# Patient Record
Sex: Male | Born: 2000 | Race: White | Hispanic: No | Marital: Single | Smoking: Never smoker
Health system: Southern US, Community
[De-identification: ages and names within clinical notes are randomized; demographics above are authoritative.]

## PROBLEM LIST (undated history)

## (undated) DIAGNOSIS — S62101A Fracture of unspecified carpal bone, right wrist, initial encounter for closed fracture: Secondary | ICD-10-CM

## (undated) HISTORY — PX: NO PAST SURGERIES: SHX2092

## (undated) HISTORY — DX: Fracture of unspecified carpal bone, right wrist, initial encounter for closed fracture: S62.101A

---
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
## 2018-05-11 ENCOUNTER — Telehealth: Payer: Self-pay | Admitting: Internal Medicine

## 2018-05-11 NOTE — Telephone Encounter (Signed)
Relation to pt: mother / Pamala Hurry  Call back number: [phone]  Reason for call:  Buel Ream [phone] patient of Dr. Quay Burow would like to refer her son to establish care. Mother stated patient is extremely low maintenance currently in college and would like to transition from Peds to PCP, please advise

## 2018-05-11 NOTE — Telephone Encounter (Signed)
Yes, I will accept him

## 2018-05-13 NOTE — Telephone Encounter (Signed)
LVM for patient Raymond Brock or patient to call back to schedule Please call office for questions or concerns.  May be able to fit in.

## 2018-05-17 NOTE — Telephone Encounter (Signed)
appt made

## 2018-06-08 ENCOUNTER — Telehealth: Payer: Self-pay

## 2018-06-08 DIAGNOSIS — Z23 Encounter for immunization: Secondary | ICD-10-CM

## 2018-06-08 NOTE — Telephone Encounter (Signed)
Pt is transferring care to Dr. Quay Burow on 07/11/2018.  Rq for pt have have Bexsero vaccine. NCIR printed by [employer]. Vaccines are Up to Date other than Meningococcal B.   Per verbal okay from Dr. Quay Burow, okay to give at nurse visit before transfer care visit. Pt has been scheduled for a nurse visit on Monday, tentatively at 10am for now. This time may need to be change.   Order for vaccine has been entered on the nurse visit.

## 2018-06-08 NOTE — Telephone Encounter (Signed)
Yes ok to give

## 2018-06-09 NOTE — Telephone Encounter (Signed)
Vaccine ordered for [facility] office

## 2018-06-13 ENCOUNTER — Ambulatory Visit (INDEPENDENT_AMBULATORY_CARE_PROVIDER_SITE_OTHER): Payer: PRIVATE HEALTH INSURANCE

## 2018-06-13 DIAGNOSIS — Z299 Encounter for prophylactic measures, unspecified: Secondary | ICD-10-CM | POA: Diagnosis not present

## 2018-06-13 DIAGNOSIS — Z23 Encounter for immunization: Secondary | ICD-10-CM | POA: Diagnosis not present

## 2018-06-13 NOTE — Progress Notes (Signed)
Raymond Brock

## 2018-07-10 NOTE — Progress Notes (Signed)
Subjective:    Patient ID: Raymond Brock, male    DOB: 2001-03-01, 17 y.o.   MRN: [id number]  HPI  He is here to establish with a new pcp.   He is here for a physical exam.   He is getting over a cold now, but feels he is on the tail end of it.  He has some nasal congestion and wet cough.  He is taking otc cold meds.    He has no concerns.  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 He is not sure what he will be studying.    His left clavicle goes in and out of place.  It causes some discomfort at times.  He denies any major injury.    Medications and allergies reviewed with patient and updated if appropriate.  Patient Active Problem List   Diagnosis Date Noted  . Right-sided low back pain without sciatica 05/23/2015  . 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 Heel pain 03/13/2013    No current outpatient medications on file prior to visit.   No current facility-administered medications on file prior to visit.     Past Medical History:  Diagnosis Date  . 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 NO PAST SURGERIES      Social History   Socioeconomic History  . Marital status: Single    Spouse name: Not on file  . Number of children: Not on file  . Years of education: Not on file  . Highest education level: Not on file  Occupational History  . Not on file  Social Needs  . Financial resource strain: Not on file  . Food insecurity:    Worry: Not on file    Inability: Not on file  . Transportation needs:    Medical: Not on file    Non-medical: Not on file  Tobacco Use  . Smoking status: Never Smoker  . Smokeless tobacco: Never Used  Substance and Sexual Activity  . Alcohol use: No  . Drug use: No  . Sexual activity: Not on file  Lifestyle  . Physical activity:    Days per week: Not on file    Minutes per session: Not on file  .  Stress: Not on file  Relationships  . Social connections:    Talks on phone: Not on file    Gets together: Not on file    Attends religious service: Not on file    Active member of club or organization: Not on file    Attends meetings of clubs or organizations: Not on file    Relationship status: Not on file  Other Topics Concern  . Not on file  Social History Narrative  . Not on file    Family History  Problem Relation Age of Onset  . Cancer Mother   . Atrial fibrillation Father   . Hypertension Maternal Grandfather   . Cancer Maternal Grandfather        Colon  . Hyperlipidemia Maternal Grandfather   . Cancer Paternal Grandmother   . Hypertension Maternal Grandmother     Review of Systems  Constitutional: Negative for chills, fatigue and fever.  HENT: Positive for congestion.   Eyes: Negative for visual disturbance.  Respiratory: Positive for cough. Negative for shortness of breath and wheezing.   Cardiovascular: Negative for  chest pain, palpitations and leg swelling.  Gastrointestinal: Negative for abdominal pain, blood in stool, constipation, diarrhea and nausea.       No gerd  Genitourinary: Negative for dysuria and hematuria.  Musculoskeletal: Positive for back pain (rare). Negative for arthralgias.  Skin: Negative for color change and rash.  Neurological: Negative for light-headedness and headaches.  Psychiatric/Behavioral: Negative for dysphoric mood and sleep disturbance. The patient is not nervous/anxious.        Objective:   Vitals:   07/11/18 0815  BP: 118/70  Pulse: 82  Resp: 16  Temp: 98.1 F (36.7 C)  SpO2: 99%   Filed Weights   07/11/18 0815  Weight: 159 lb (72.1 kg)   Body mass index is 19.87 kg/m.  Wt Readings from Last 3 Encounters:  07/11/18 159 lb (72.1 kg) (60 %, Z= 0.25)*  01/08/17 155 lb 3.2 oz (70.4 kg) (65 %, Z= 0.37)*  05/28/15 141 lb (64 kg) (62 %, Z= 0.31)*   * Growth percentiles are based on CDC (Boys, 2-20 Years) data.      Physical Exam Constitutional: He appears well-developed and well-nourished. No distress.  HENT:  Head: Normocephalic and atraumatic.  Right Ear: External ear normal.  Left Ear: External ear normal.  Mouth/Throat: Oropharynx is clear and moist.  Normal ear canals and TM b/l  Eyes: Conjunctivae and EOM are normal.  Neck: Neck supple. No tracheal deviation present. No thyromegaly present.  No carotid bruit  Cardiovascular: Normal rate, regular rhythm, normal heart sounds and intact distal pulses.   No murmur heard. Pulmonary/Chest: Effort normal and breath sounds normal. No respiratory distress. He has no wheezes. He has no rales.  Abdominal: Soft. He exhibits no distension. There is no tenderness.  Genitourinary: deferred  Musculoskeletal: He exhibits no edema.  Lymphadenopathy:   He has no cervical adenopathy.  Skin: Skin is warm and dry. He is not diaphoretic.  Psychiatric: He has a normal mood and affect. His behavior is normal.         Assessment & Plan:   Physical exam: Screening blood work   deferred Immunizations   Meningitis #2 today , we do not have the flu vaccine to give Exercise  Goes to gym Weight    Good for age Skin        No concerns Substance abuse   None Discussed self testicular exams Discussed briefly going to college  See Problem List for Assessment and Plan of chronic medical problems.    FU as needed

## 2018-07-11 ENCOUNTER — Encounter: Payer: Self-pay | Admitting: Internal Medicine

## 2018-07-11 ENCOUNTER — Ambulatory Visit (INDEPENDENT_AMBULATORY_CARE_PROVIDER_SITE_OTHER): Payer: PRIVATE HEALTH INSURANCE | Admitting: Internal Medicine

## 2018-07-11 VITALS — BP 118/70 | HR 82 | Temp 98.1°F | Resp 16 | Ht 75.0 in | Wt 159.0 lb

## 2018-07-11 DIAGNOSIS — Z23 Encounter for immunization: Secondary | ICD-10-CM | POA: Diagnosis not present

## 2018-07-11 DIAGNOSIS — Z0000 Encounter for general adult medical examination without abnormal findings: Secondary | ICD-10-CM | POA: Diagnosis not present

## 2018-07-11 NOTE — Patient Instructions (Addendum)
All other Health Maintenance issues reviewed.   All recommended immunizations and age-appropriate screenings are up-to-date or discussed.  Meningitis immunization administered today.   Medications reviewed and updated.  No changes recommended at this time.      Health Maintenance, Male A healthy lifestyle and preventive care is important for your health and wellness. Ask your health care provider about what schedule of regular examinations is right for you. What should I know about weight and diet? Eat a Healthy Diet  Eat plenty of vegetables, fruits, whole grains, low-fat dairy products, and lean protein.  Do not eat a lot of foods high in solid fats, added sugars, or salt.  Maintain a Healthy Weight Regular exercise can help you achieve or maintain a healthy weight. You should:  Do at least 150 minutes of exercise each week. The exercise should increase your heart rate and make you sweat (moderate-intensity exercise).  Do strength-training exercises at least twice a week.  Watch Your Levels of Cholesterol and Blood Lipids  Have your blood tested for lipids and cholesterol every 5 years starting at 19 years of age. If you are at high risk for heart disease, you should start having your blood tested when you are 19 years old. You may need to have your cholesterol levels checked more often if: ? Your lipid or cholesterol levels are high. ? You are older than 19 years of age. ? You are at high risk for heart disease.  What should I know about cancer screening? Many types of cancers can be detected early and may often be prevented. Lung Cancer  You should be screened every year for lung cancer if: ? You are a current smoker who has smoked for at least 30 years. ? You are a former smoker who has quit within the past 15 years.  Talk to your health care provider about your screening options, when you should start screening, and how often you should be screened.  Colorectal  Cancer  Routine colorectal cancer screening usually begins at 19 years of age and should be repeated every 5-10 years until you are 19 years old. You may need to be screened more often if early forms of precancerous polyps or small growths are found. Your health care provider may recommend screening at an earlier age if you have risk factors for colon cancer.  Your health care provider may recommend using home test kits to check for hidden blood in the stool.  A small camera at the end of a tube can be used to examine your colon (sigmoidoscopy or colonoscopy). This checks for the earliest forms of colorectal cancer.  Prostate and Testicular Cancer  Depending on your age and overall health, your health care provider may do certain tests to screen for prostate and testicular cancer.  Talk to your health care provider about any symptoms or concerns you have about testicular or prostate cancer.  Skin Cancer  Check your skin from head to toe regularly.  Tell your health care provider about any new moles or changes in moles, especially if: ? There is a change in a mole's size, shape, or color. ? You have a mole that is larger than a pencil eraser.  Always use sunscreen. Apply sunscreen liberally and repeat throughout the day.  Protect yourself by wearing long sleeves, pants, a wide-brimmed hat, and sunglasses when outside.  What should I know about heart disease, diabetes, and high blood pressure?  If you are 16-2 years of age, have  your blood pressure checked every 3-5 years. If you are 85 years of age or older, have your blood pressure checked every year. You should have your blood pressure measured twice-once when you are at a hospital or clinic, and once when you are not at a hospital or clinic. Record the average of the two measurements. To check your blood pressure when you are not at a hospital or clinic, you can use: ? An automated blood pressure machine at a pharmacy. ? A home blood  pressure monitor.  Talk to your health care provider about your target blood pressure.  If you are between 64-84 years old, ask your health care provider if you should take aspirin to prevent heart disease.  Have regular diabetes screenings by checking your fasting blood sugar level. ? If you are at a normal weight and have a low risk for diabetes, have this test once every three years after the age of 81. ? If you are overweight and have a high risk for diabetes, consider being tested at a younger age or more often.  A one-time screening for abdominal aortic aneurysm (AAA) by ultrasound is recommended for men aged 22-75 years who are current or former smokers. What should I know about preventing infection? Hepatitis B If you have a higher risk for hepatitis B, you should be screened for this virus. Talk with your health care provider to find out if you are at risk for hepatitis B infection. Hepatitis C Blood testing is recommended for:  Everyone born from [birth year] through 1967.  Anyone with known risk factors for hepatitis C.  Sexually Transmitted Diseases (STDs)  You should be screened each year for STDs including gonorrhea and chlamydia if: ? You are sexually active and are younger than 19 years of age. ? You are older than 19 years of age and your health care provider tells you that you are at risk for this type of infection. ? Your sexual activity has changed since you were last screened and you are at an increased risk for chlamydia or gonorrhea. Ask your health care provider if you are at risk.  Talk with your health care provider about whether you are at high risk of being infected with HIV. Your health care provider may recommend a prescription medicine to help prevent HIV infection.  What else can I do?  Schedule regular health, dental, and eye exams.  Stay current with your vaccines (immunizations).  Do not use any tobacco products, such as cigarettes, chewing tobacco, and  e-cigarettes. If you need help quitting, ask your health care provider.  Limit alcohol intake to no more than 2 drinks per day. One drink equals 12 ounces of beer, 5 ounces of wine, or 1 ounces of hard liquor.  Do not use street drugs.  Do not share needles.  Ask your health care provider for help if you need support or information about quitting drugs.  Tell your health care provider if you often feel depressed.  Tell your health care provider if you have ever been abused or do not feel safe at home. This information is not intended to replace advice given to you by your health care provider. Make sure you discuss any questions you have with your health care provider. Document Released: 05/14/2008 Document Revised: 07/15/2016 Document Reviewed: 08/20/2015 Elsevier Interactive Patient Education  [employer].

## 2018-10-25 ENCOUNTER — Telehealth: Payer: Self-pay | Admitting: Internal Medicine

## 2018-10-25 MED ORDER — OSELTAMIVIR PHOSPHATE 75 MG PO CAPS: 75.0000 mg | ORAL_CAPSULE | Freq: Two times a day (BID) | ORAL | 0 refills | Status: AC

## 2018-10-25 NOTE — Telephone Encounter (Signed)
Communicated with father 17 yo son with new onset fever, chills, headache. (tttttttemp 103).  Consistent             With influenza. Worth ttreating--  tamiflu 75 bid x 5 days.

## 2018-11-17 ENCOUNTER — Ambulatory Visit (INDEPENDENT_AMBULATORY_CARE_PROVIDER_SITE_OTHER): Payer: PRIVATE HEALTH INSURANCE | Admitting: Internal Medicine

## 2018-11-17 ENCOUNTER — Ambulatory Visit: Payer: PRIVATE HEALTH INSURANCE | Admitting: Family Medicine

## 2018-11-17 ENCOUNTER — Ambulatory Visit (INDEPENDENT_AMBULATORY_CARE_PROVIDER_SITE_OTHER)
Admission: RE | Admit: 2018-11-17 | Discharge: 2018-11-17 | Disposition: A | Discharge status: Home / Self Care | Payer: PRIVATE HEALTH INSURANCE | Source: Ambulatory Visit | Attending: Internal Medicine | Admitting: Internal Medicine

## 2018-11-17 ENCOUNTER — Encounter: Payer: Self-pay | Admitting: Internal Medicine

## 2018-11-17 DIAGNOSIS — J209 Acute bronchitis, unspecified: Secondary | ICD-10-CM | POA: Diagnosis not present

## 2018-11-17 MED ORDER — CEFDINIR 300 MG PO CAPS: 300.0000 mg | ORAL_CAPSULE | Freq: Two times a day (BID) | ORAL | 0 refills | Status: DC

## 2018-11-17 MED FILL — CEFDINIR 300 MG CAPSULE: 300 | 10 days supply | Qty: 20 | Fill #0

## 2018-11-17 NOTE — Patient Instructions (Signed)
You can use over-the-counter  "cold" medicines  such as "Tylenol cold" , "Advil cold",  "Mucinex" or" Mucinex D"  for cough and congestion.   Uou can use "Afrin" nasal spray for nasal congestion as directed. Use " Delsym" or" Robitussin" cough syrup varietis for cough.  You can use plain "Tylenol" or "Advil" for fever, chills and achyness. Use Halls or Ricola cough drops.    Please, make an appointment if you are not better or if you're worse.

## 2018-11-17 NOTE — Progress Notes (Signed)
Subjective:  Patient ID: Raymond Brock, male    DOB: 01/09/2001  Age: 17 y.o. MRN: [id number]  CC: No chief complaint on file.   HPI INRI SOBIESKI presents for URI sx's w/a bad cough x 3 weeks, brown sputum  No outpatient medications prior to visit.   No facility-administered medications prior to visit.     ROS: Review of Systems  Constitutional: Positive for fatigue. Negative for appetite change, fever and unexpected weight change.  HENT: Negative for congestion, nosebleeds, sneezing, sore throat and trouble swallowing.   Eyes: Negative for itching and visual disturbance.  Respiratory: Positive for cough. Negative for shortness of breath.   Cardiovascular: Negative for chest pain, palpitations and leg swelling.  Gastrointestinal: Negative for abdominal distention, blood in stool, diarrhea and nausea.  Genitourinary: Negative for frequency and hematuria.  Musculoskeletal: Negative for back pain, gait problem, joint swelling and neck pain.  Skin: Negative for rash.  Neurological: Negative for dizziness, tremors, speech difficulty and weakness.  Hematological: Negative for adenopathy. Does not bruise/bleed easily.  Psychiatric/Behavioral: Negative for agitation, dysphoric mood and sleep disturbance. The patient is not nervous/anxious.     Objective:  BP (!) 152/88 (BP Location: Left Arm, Patient Position: Sitting, Cuff Size: Normal)   Pulse 79   Temp 98.2 F (36.8 C) (Oral)   Ht 6\' 3"  (1.905 m)   Wt 162 lb (73.5 kg)   SpO2 98%   BMI 20.25 kg/m   BP Readings from Last 3 Encounters:  11/17/18 (!) 152/88  07/11/18 118/70  01/08/17 114/72    Wt Readings from Last 3 Encounters:  11/17/18 162 lb (73.5 kg) (62 %, Z= 0.32)*  07/11/18 159 lb (72.1 kg) (60 %, Z= 0.25)*  01/08/17 155 lb 3.2 oz (70.4 kg) (65 %, Z= 0.37)*   * Growth percentiles are based on CDC (Boys, 2-20 Years) data.    Physical Exam Constitutional:      General: He is not in acute distress.  Appearance: He is well-developed.     Comments: NAD  Eyes:     Conjunctiva/sclera: Conjunctivae normal.     Pupils: Pupils are equal, round, and reactive to light.  Neck:     Musculoskeletal: Normal range of motion.     Thyroid: No thyromegaly.     Vascular: No JVD.  Cardiovascular:     Rate and Rhythm: Normal rate and regular rhythm.     Heart sounds: Normal heart sounds. No murmur. No friction rub. No gallop.   Pulmonary:     Effort: Pulmonary effort is normal. No respiratory distress.     Breath sounds: Normal breath sounds. No wheezing or rales.  Chest:     Chest wall: No tenderness.  Abdominal:     General: Bowel sounds are normal. There is no distension.     Palpations: Abdomen is soft. There is no mass.     Tenderness: There is no abdominal tenderness. There is no guarding or rebound.  Musculoskeletal: Normal range of motion.        General: No tenderness.  Lymphadenopathy:     Cervical: No cervical adenopathy.  Skin:    General: Skin is warm and dry.     Findings: No rash.  Neurological:     Mental Status: He is alert and oriented to person, place, and time.     Cranial Nerves: No cranial nerve deficit.     Motor: No abnormal muscle tone.     Coordination: Coordination normal.  Gait: Gait normal.     Deep Tendon Reflexes: Reflexes are normal and symmetric.  Psychiatric:        Behavior: Behavior normal.        Thought Content: Thought content normal.        Judgment: Judgment normal.   eryth throat  No results found for: WBC, HGB, HCT, PLT, GLUCOSE, CHOL, TRIG, HDL, LDLDIRECT, LDLCALC, ALT, AST, NA, K, CL, CREATININE, BUN, CO2, TSH, PSA, INR, GLUF, HGBA1C, MICROALBUR  Dg Thoracolumabar Spine  Result Date: 05/23/2015 CLINICAL DATA:  Right side low back pain, no sciatica, question Scheuermann's disease EXAM: THORACOLUMBAR SPINE 1V COMPARISON:  None. FINDINGS: Two views of thoracolumbar spine submitted. No acute fracture or subluxation. Alignment, disc spaces and  vertebral body heights are preserved. IMPRESSION: Negative Electronically Signed   By: Lahoma Crocker M.D.   On: 05/23/2015 15:06   Mr Lumbar Spine Wo Contrast  Result Date: 05/23/2015 CLINICAL DATA:  Right lower back pain acute exacerbation, with increased weakness. EXAM: MRI LUMBAR SPINE WITHOUT CONTRAST TECHNIQUE: Multiplanar, multisequence MR imaging of the lumbar spine was performed. No intravenous contrast was administered. COMPARISON:  None FINDINGS: The lowest lumbar type non-rib-bearing vertebra is labeled as L5. The conus medullaris appears normal. Conus level: L1. No vertebral subluxation is observed. I do not see any kyphotic angulation on recent radiographs involving the lower half of the thoracic spine or the lumbar spine. There do appear to be some small Schmorl's nodes in the lower thoracic spine and lumbar spine, most notably along the inferior endplate of L1 on image 5 series 5. Additional findings at individual levels are as follows: L1-2: Unremarkable. L2-3:  Unremarkable. L3-4:  No impingement.  Equivocal disc bulge. L4-5:  No impingement.  Equivocal disc bulge. L5-S1:  Unremarkable. IMPRESSION: 1. No lumbar spine impingement identified to explain the patient's back pain. 2. Equivocal bulging of the disc at L3-4 and L4-5. 3. There several Schmorl' s nodes in the lumbar spine, most notably along the inferior endplate of L1. These can sometimes be associated with Scheuermann's disease, but I do not observe significant wedging in the lumbar spine or visualized portion of the lower thoracic spine on the lateral projection to further suggest Scheuermann's disease. Electronically Signed   By: Van Clines M.D.   On: 05/23/2015 14:32    Assessment & Plan:   There are no diagnoses linked to this encounter.   No orders of the defined types were placed in this encounter.    Follow-up: No follow-ups on file.  Walker Kehr, MD

## 2018-11-17 NOTE — Assessment & Plan Note (Addendum)
CXR today Cefdinir Rx Labs if not better

## 2019-01-24 DIAGNOSIS — J4530 Mild persistent asthma, uncomplicated: Secondary | ICD-10-CM | POA: Diagnosis not present

## 2019-01-24 DIAGNOSIS — J302 Other seasonal allergic rhinitis: Secondary | ICD-10-CM | POA: Diagnosis not present

## 2019-02-16 MED FILL — MONTELUKAST SOD 10 MG TAB: 10 | 30 days supply | Qty: 30 | Fill #0

## 2019-02-16 MED FILL — ARNUITY ELLIPTA 100 MCG INH: 100 | 30 days supply | Qty: 30 | Fill #0

## 2019-04-14 MED FILL — ARNUITY ELLIPTA 100 MCG INH: 100 | 30 days supply | Qty: 30 | Fill #1

## 2019-04-14 MED FILL — MONTELUKAST SOD 10 MG TAB: 10 | 30 days supply | Qty: 30 | Fill #1

## 2019-05-30 MED FILL — ARNUITY ELLIPTA 100 MCG INH: 100 | 30 days supply | Qty: 30 | Fill #2

## 2019-05-30 MED FILL — MONTELUKAST SOD 10 MG TAB: 10 | 30 days supply | Qty: 30 | Fill #2

## 2019-06-13 ENCOUNTER — Other Ambulatory Visit: Payer: Self-pay | Admitting: Internal Medicine

## 2019-06-13 DIAGNOSIS — Z20822 Contact with and (suspected) exposure to covid-19: Secondary | ICD-10-CM

## 2019-06-13 DIAGNOSIS — Z20828 Contact with and (suspected) exposure to other viral communicable diseases: Secondary | ICD-10-CM

## 2019-07-07 ENCOUNTER — Telehealth: Payer: Self-pay

## 2019-07-07 NOTE — Telephone Encounter (Signed)
Appointment scheduled.

## 2019-07-07 NOTE — Telephone Encounter (Signed)
Copied from [place] [id number]. Topic: Appointment Scheduling - Scheduling Inquiry for Clinic >> Jul 07, 2019  2:30 PM Pauline Good wrote: Reason for CRM: pt want to sched a TB Skin test. Please advise

## 2019-07-07 NOTE — Telephone Encounter (Signed)
Pt needs a CPE due on 8/12 can do at CPE if not on thursday

## 2019-07-13 NOTE — Patient Instructions (Addendum)
Tests ordered today. Your results will be released to [facility] (or called to you) after review.  If any changes need to be made, you will be notified at that same time.  All other Health Maintenance issues reviewed.   All recommended immunizations and age-appropriate screenings are up-to-date or discussed.  TB test implanted.  No immunization administered today.   Medications reviewed and updated.  Changes include :   none   Please followup in 1 year   Health Maintenance, Male Adopting a healthy lifestyle and getting preventive care are important in promoting health and wellness. Ask your health care provider about:  The right schedule for you to have regular tests and exams.  Things you can do on your own to prevent diseases and keep yourself healthy. What should I know about diet, weight, and exercise? Eat a healthy diet   Eat a diet that includes plenty of vegetables, fruits, low-fat dairy products, and lean protein.  Do not eat a lot of foods that are high in solid fats, added sugars, or sodium. Maintain a healthy weight Body mass index (BMI) is a measurement that can be used to identify possible weight problems. It estimates body fat based on height and weight. Your health care provider can help determine your BMI and help you achieve or maintain a healthy weight. Get regular exercise Get regular exercise. This is one of the most important things you can do for your health. Most adults should:  Exercise for at least 150 minutes each week. The exercise should increase your heart rate and make you sweat (moderate-intensity exercise).  Do strengthening exercises at least twice a week. This is in addition to the moderate-intensity exercise.  Spend less time sitting. Even light physical activity can be beneficial. Watch cholesterol and blood lipids Have your blood tested for lipids and cholesterol at 20 years of age, then have this test every 5 years. You may need to have your  cholesterol levels checked more often if:  Your lipid or cholesterol levels are high.  You are older than 20 years of age.  You are at high risk for heart disease. What should I know about cancer screening? Many types of cancers can be detected early and may often be prevented. Depending on your health history and family history, you may need to have cancer screening at various ages. This may include screening for:  Colorectal cancer.  Prostate cancer.  Skin cancer.  Lung cancer. What should I know about heart disease, diabetes, and high blood pressure? Blood pressure and heart disease  High blood pressure causes heart disease and increases the risk of stroke. This is more likely to develop in people who have high blood pressure readings, are of African descent, or are overweight.  Talk with your health care provider about your target blood pressure readings.  Have your blood pressure checked: ? Every 3-5 years if you are 14-75 years of age. ? Every year if you are 39 years old or older.  If you are between the ages of 68 and 57 and are a current or former smoker, ask your health care provider if you should have a one-time screening for abdominal aortic aneurysm (AAA). Diabetes Have regular diabetes screenings. This checks your fasting blood sugar level. Have the screening done:  Once every three years after age 62 if you are at a normal weight and have a low risk for diabetes.  More often and at a younger age if you are overweight or have  a high risk for diabetes. What should I know about preventing infection? Hepatitis B If you have a higher risk for hepatitis B, you should be screened for this virus. Talk with your health care provider to find out if you are at risk for hepatitis B infection. Hepatitis C Blood testing is recommended for:  Everyone born from [birth year] through 1967.  Anyone with known risk factors for hepatitis C. Sexually transmitted infections (STIs)  You  should be screened each year for STIs, including gonorrhea and chlamydia, if: ? You are sexually active and are younger than 20 years of age. ? You are older than 20 years of age and your health care provider tells you that you are at risk for this type of infection. ? Your sexual activity has changed since you were last screened, and you are at increased risk for chlamydia or gonorrhea. Ask your health care provider if you are at risk.  Ask your health care provider about whether you are at high risk for HIV. Your health care provider may recommend a prescription medicine to help prevent HIV infection. If you choose to take medicine to prevent HIV, you should first get tested for HIV. You should then be tested every 3 months for as long as you are taking the medicine. Follow these instructions at home: Lifestyle  Do not use any products that contain nicotine or tobacco, such as cigarettes, e-cigarettes, and chewing tobacco. If you need help quitting, ask your health care provider.  Do not use street drugs.  Do not share needles.  Ask your health care provider for help if you need support or information about quitting drugs. Alcohol use  Do not drink alcohol if your health care provider tells you not to drink.  If you drink alcohol: ? Limit how much you have to 0-2 drinks a day. ? Be aware of how much alcohol is in your drink. In the [place] one drink equals one 12 oz bottle of beer (355 mL), one 5 oz glass of wine (148 mL), or one 1 oz glass of hard liquor (44 mL). General instructions  Schedule regular health, dental, and eye exams.  Stay current with your vaccines.  Tell your health care provider if: ? You often feel depressed. ? You have ever been abused or do not feel safe at home. Summary  Adopting a healthy lifestyle and getting preventive care are important in promoting health and wellness.  Follow your health care provider's instructions about healthy diet, exercising, and  getting tested or screened for diseases.  Follow your health care provider's instructions on monitoring your cholesterol and blood pressure. This information is not intended to replace advice given to you by your health care provider. Make sure you discuss any questions you have with your health care provider. Document Released: 05/14/2008 Document Revised: 11/09/2018 Document Reviewed: 11/09/2018 Elsevier Patient Education  2020 [employer].

## 2019-07-13 NOTE — Progress Notes (Signed)
Subjective:    Patient ID: Raymond Brock, male    DOB: 06-Dec-2000, 18 y.o.   MRN: [id number]  HPI He is here for a physical exam.   He denies any changes in his health.  He needs a TB test.  He has no concerns.    Medications and allergies reviewed with patient and updated if appropriate.  Patient Active Problem List   Diagnosis Date Noted  . Right-sided low back pain without sciatica 05/23/2015  . 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 Heel pain 03/13/2013    No current outpatient medications on file prior to visit.   No current facility-administered medications on file prior to visit.     Past Medical History:  Diagnosis Date  . 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 NO PAST SURGERIES      Social History   Socioeconomic History  . Marital status: Single    Spouse name: Not on file  . Number of children: Not on file  . Years of education: Not on file  . Highest education level: Not on file  Occupational History  . Not on file  Social Needs  . Financial resource strain: Not on file  . Food insecurity    Worry: Not on file    Inability: Not on file  . Transportation needs    Medical: Not on file    Non-medical: Not on file  Tobacco Use  . Smoking status: Never Smoker  . Smokeless tobacco: Never Used  Substance and Sexual Activity  . Alcohol use: No  . Drug use: No  . Sexual activity: Not on file  Lifestyle  . Physical activity    Days per week: Not on file    Minutes per session: Not on file  . Stress: Not on file  Relationships  . Social Herbalist on phone: Not on file    Gets together: Not on file    Attends religious service: Not on file    Active member of club or organization: Not on file    Attends meetings of clubs or organizations: Not on file    Relationship status: Not on file  Other Topics Concern  . Not on file  Social History  Narrative  . Not on file    Family History  Problem Relation Age of Onset  . Cancer Mother   . Atrial fibrillation Father   . Hypertension Maternal Grandfather   . Cancer Maternal Grandfather        Colon  . Hyperlipidemia Maternal Grandfather   . Cancer Paternal Grandmother   . Hypertension Maternal Grandmother     Review of Systems  Constitutional: Negative for chills and fever.  Eyes: Negative for visual disturbance.  Respiratory: Negative for cough, shortness of breath and wheezing.   Cardiovascular: Negative for chest pain, palpitations and leg swelling.  Gastrointestinal: Negative for abdominal pain, blood in stool, constipation, diarrhea and nausea.  Genitourinary: Negative for dysuria and hematuria.  Musculoskeletal: Negative for arthralgias and back pain.  Skin: Negative for color change and rash.  Neurological: Negative for light-headedness and headaches.  Psychiatric/Behavioral: Negative for dysphoric mood. The patient is not nervous/anxious.        Objective:   Vitals:   07/14/19 1001  BP: 124/76  Pulse: 80  Resp: 14  Temp: 98 F (36.7 C)  SpO2: 98%  Filed Weights   07/14/19 1001  Weight: 159 lb 12.8 oz (72.5 kg)   Body mass index is 19.97 kg/m.  Wt Readings from Last 3 Encounters:  07/14/19 159 lb 12.8 oz (72.5 kg)  11/17/18 162 lb (73.5 kg) (62 %, Z= 0.32)*  07/11/18 159 lb (72.1 kg) (60 %, Z= 0.25)*   * Growth percentiles are based on CDC (Boys, 2-20 Years) data.     Physical Exam Constitutional: He appears well-developed and well-nourished. No distress.  HENT:  Head: Normocephalic and atraumatic.  Right Ear: External ear normal.  Left Ear: External ear normal.  Mouth/Throat: Oropharynx is clear and moist.  Normal ear canals and TM b/l  Eyes: Conjunctivae and EOM are normal.  Neck: Neck supple. No tracheal deviation present. No thyromegaly present.  No carotid bruit  Cardiovascular: Normal rate, regular rhythm, normal heart sounds and  intact distal pulses.   No murmur heard. Pulmonary/Chest: Effort normal and breath sounds normal. No respiratory distress. He has no wheezes. He has no rales.  Abdominal: Soft. He exhibits no distension. There is no tenderness.  Genitourinary: deferred  Musculoskeletal: He exhibits no edema.  Lymphadenopathy:   He has no cervical adenopathy.  Skin: Skin is warm and dry. He is not diaphoretic.  Psychiatric: He has a normal mood and affect. His behavior is normal.         Assessment & Plan:   Physical exam: Screening blood work  ordered Immunizations  tdap up to date, recommended flu vaccine Exercise  regular Weight  Normal BMI Skin no concerns.  Substance abuse   none  See Problem List for Assessment and Plan of chronic medical problems.  FU in one year

## 2019-07-14 ENCOUNTER — Encounter: Payer: Self-pay | Admitting: Internal Medicine

## 2019-07-14 ENCOUNTER — Other Ambulatory Visit: Payer: Self-pay

## 2019-07-14 ENCOUNTER — Ambulatory Visit (INDEPENDENT_AMBULATORY_CARE_PROVIDER_SITE_OTHER): Payer: 59 | Admitting: Internal Medicine

## 2019-07-14 ENCOUNTER — Other Ambulatory Visit (INDEPENDENT_AMBULATORY_CARE_PROVIDER_SITE_OTHER): Payer: 59

## 2019-07-14 VITALS — BP 124/76 | HR 80 | Temp 98.0°F | Resp 14 | Ht 75.0 in | Wt 159.8 lb

## 2019-07-14 DIAGNOSIS — Z0000 Encounter for general adult medical examination without abnormal findings: Secondary | ICD-10-CM | POA: Diagnosis not present

## 2019-07-14 DIAGNOSIS — Z23 Encounter for immunization: Secondary | ICD-10-CM

## 2019-07-14 LAB — CBC WITH DIFFERENTIAL/PLATELET
Basophils Absolute: 0.1 10*3/uL (ref 0.0–0.1)
Basophils Relative: 1 % (ref 0.0–3.0)
Eosinophils Absolute: 0.7 10*3/uL (ref 0.0–0.7)
Eosinophils Relative: 10.6 % — ABNORMAL HIGH (ref 0.0–5.0)
HCT: 46.7 % (ref 39.0–52.0)
Hemoglobin: 16.5 g/dL (ref 13.0–17.0)
Lymphocytes Relative: 36.9 % (ref 12.0–46.0)
Lymphs Abs: 2.5 10*3/uL (ref 0.7–4.0)
MCHC: 35.3 g/dL (ref 30.0–36.0)
MCV: 89.2 fl (ref 78.0–100.0)
Monocytes Absolute: 0.5 10*3/uL (ref 0.1–1.0)
Monocytes Relative: 6.8 % (ref 3.0–12.0)
Neutro Abs: 3.1 10*3/uL (ref 1.4–7.7)
Neutrophils Relative %: 44.7 % (ref 43.0–77.0)
Platelets: 259 10*3/uL (ref 150.0–400.0)
RBC: 5.24 Mil/uL (ref 4.22–5.81)
RDW: 11.9 % (ref 11.5–14.6)
WBC: 6.9 10*3/uL (ref 4.5–10.5)

## 2019-07-14 LAB — COMPREHENSIVE METABOLIC PANEL
ALT: 12 U/L (ref 0–53)
AST: 14 U/L (ref 0–37)
Albumin: 5 g/dL (ref 3.5–5.2)
Alkaline Phosphatase: 89 U/L (ref 39–117)
BUN: 19 mg/dL (ref 6–23)
CO2: 28 mEq/L (ref 19–32)
Calcium: 9.8 mg/dL (ref 8.4–10.5)
Chloride: 103 mEq/L (ref 96–112)
Creatinine, Ser: 1.05 mg/dL (ref 0.40–1.50)
GFR: 90 mL/min (ref >60.00)
Glucose, Bld: 99 mg/dL (ref 70–99)
Potassium: 3.8 mEq/L (ref 3.5–5.1)
Sodium: 139 mEq/L (ref 135–145)
Total Bilirubin: 0.8 mg/dL (ref 0.2–1.2)
Total Protein: 7.4 g/dL (ref 6.0–8.3)

## 2019-07-14 LAB — LIPID PANEL
Cholesterol: 125 mg/dL (ref 0–200)
HDL: 47.3 mg/dL (ref >39.00)
LDL Cholesterol: 64 mg/dL (ref 0–99)
NonHDL: 77.83
Total CHOL/HDL Ratio: 3
Triglycerides: 69 mg/dL (ref 0.0–149.0)
VLDL: 13.8 mg/dL (ref 0.0–40.0)

## 2019-07-14 LAB — TSH: TSH: 3.63 u[IU]/mL (ref 0.35–5.50)

## 2019-07-14 MED FILL — MONTELUKAST SOD 10 MG TAB: 10 | 30 days supply | Qty: 30 | Fill #3

## 2019-07-17 LAB — TB SKIN TEST
Induration: 0 mm
TB Skin Test: NEGATIVE

## 2019-08-15 ENCOUNTER — Ambulatory Visit: Payer: Self-pay

## 2019-08-15 ENCOUNTER — Other Ambulatory Visit: Payer: 59

## 2019-08-15 ENCOUNTER — Encounter: Payer: Self-pay | Admitting: Internal Medicine

## 2019-08-15 ENCOUNTER — Other Ambulatory Visit: Payer: Self-pay

## 2019-08-15 ENCOUNTER — Ambulatory Visit (INDEPENDENT_AMBULATORY_CARE_PROVIDER_SITE_OTHER): Payer: 59 | Admitting: Internal Medicine

## 2019-08-15 VITALS — BP 126/78 | HR 72 | Temp 99.0°F | Resp 16 | Ht 75.0 in | Wt 153.8 lb

## 2019-08-15 DIAGNOSIS — Z111 Encounter for screening for respiratory tuberculosis: Secondary | ICD-10-CM

## 2019-08-15 DIAGNOSIS — Z23 Encounter for immunization: Secondary | ICD-10-CM

## 2019-08-15 DIAGNOSIS — H00019 Hordeolum externum unspecified eye, unspecified eyelid: Secondary | ICD-10-CM | POA: Insufficient documentation

## 2019-08-15 DIAGNOSIS — H00011 Hordeolum externum right upper eyelid: Secondary | ICD-10-CM | POA: Diagnosis not present

## 2019-08-15 MED ORDER — POLYMYXIN B-TRIMETHOPRIM 10000-0.1 UNIT/ML-% OP SOLN: 1.0000 [drp] | OPHTHALMIC | 0 refills | Status: DC

## 2019-08-15 MED FILL — POLYMYXIN B/TMP EYE DROPS: 10000-0.1 | 30 days supply | Qty: 10 | Fill #0

## 2019-08-15 MED FILL — MONTELUKAST SOD 10 MG TAB: 10 | 90 days supply | Qty: 90 | Fill #4

## 2019-08-15 NOTE — Telephone Encounter (Signed)
Can he come in at 1: 15?

## 2019-08-15 NOTE — Telephone Encounter (Signed)
Mom reports pt. Noticed swelling to right eye yesterday. More swelling this morning to the upper eye lid. No drainage noted. No change in vision. No runny nose or cough or allergy symptom.No fever.Reports tear duct seems swollen as well. Warm transfer to Sam in the practice for a visit.  Answer Assessment - Initial Assessment Questions 1. ONSET: "When did the swelling start?" (e.g., minutes, hours, days)     Swelling right eye 2. LOCATION: "What part of the eyelids is swollen?"     Top lid 3. SEVERITY: "How swollen is it?"     Moderate 4. ITCHING: "Is there any itching?" If so, ask: "How much?"   (Scale 1-10; mild, moderate or severe)     Mild 5. PAIN: "Is the swelling painful to touch?" If so, ask: "How painful is it?"   (Scale 1-10; mild, moderate or severe)     4 6. FEVER: "Do you have a fever?" If so, ask: "What is it, how was it measured, and when did it start?"      No 7. CAUSE: "What do you think is causing the swelling?"     Unsure 8. RECURRENT SYMPTOM: "Have you had eyelid swelling before?" If so, ask: "When was the last time?" "What happened that time?"     No 9. OTHER SYMPTOMS: "Do you have any other symptoms?" (e.g., blurred vision, eye discharge, rash, runny nose)     No 10. PREGNANCY: "Is there any chance you are pregnant?" "When was your last menstrual period?"       n/a  Protocols used: E[facility] - [facility]

## 2019-08-15 NOTE — Telephone Encounter (Signed)
Pt has an appointment at 3:00

## 2019-08-15 NOTE — Patient Instructions (Addendum)
Have blood work done today.    Use the antibiotic eye drop in the right eye as prescribed.  Apply warm compresses.    Flu immunization administered today.     Please call if there is no improvement in your symptoms.

## 2019-08-15 NOTE — Progress Notes (Signed)
Subjective:    Patient ID: Raymond Brock, male    DOB: 07/29/2001, 18 y.o.   MRN: [id number]  HPI The patient is here for an acute visit.  His mother is here with him.  He does need a blood test for screening for tuberculosis for school.  Yesterday he started to have a feeling of a mild bruise in the corner of his right thigh.  He denies any trauma or injury.  Today he noticed that the right upper eyelid in the medial corner is slightly swollen and red.  He did have a slight amount of discharge in the corner of his eye.  There is slight discomfort.  He denies any conjunctival erythema or changes in vision.  He denies any fever, chills, headaches or cold symptoms.  There has been no significant personal ophthalmological history.  Medications and allergies reviewed with patient and updated if appropriate.  Patient Active Problem List   Diagnosis Date Noted  . Right-sided low back pain without sciatica 05/23/2015  . 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 Heel pain 03/13/2013    Current Outpatient Medications on File Prior to Visit  Medication Sig Dispense Refill  . montelukast (SINGULAIR) 10 MG tablet      No current facility-administered medications on file prior to visit.     Past Medical History:  Diagnosis Date  . 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 NO PAST SURGERIES      Social History   Socioeconomic History  . Marital status: Single    Spouse name: Not on file  . Number of children: Not on file  . Years of education: Not on file  . Highest education level: Not on file  Occupational History  . Not on file  Social Needs  . Financial resource strain: Not on file  . Food insecurity    Worry: Not on file    Inability: Not on file  . Transportation needs    Medical: Not on file    Non-medical: Not on file  Tobacco Use  . Smoking status: Never Smoker  . Smokeless  tobacco: Never Used  Substance and Sexual Activity  . Alcohol use: No  . Drug use: No  . Sexual activity: Not on file  Lifestyle  . Physical activity    Days per week: Not on file    Minutes per session: Not on file  . Stress: Not on file  Relationships  . Social Herbalist on phone: Not on file    Gets together: Not on file    Attends religious service: Not on file    Active member of club or organization: Not on file    Attends meetings of clubs or organizations: Not on file    Relationship status: Not on file  Other Topics Concern  . Not on file  Social History Narrative  . Not on file    Family History  Problem Relation Age of Onset  . Cancer Mother   . Atrial fibrillation Father   . Hypertension Maternal Grandfather   . Cancer Maternal Grandfather        Colon  . Hyperlipidemia Maternal Grandfather   . Cancer Paternal Grandmother   . Hypertension Maternal Grandmother     Review of Systems  Constitutional: Negative for chills and fever.  HENT: Negative for congestion, ear pain, sinus pain  and sore throat.   Eyes: Positive for discharge. Negative for pain, redness and visual disturbance.       Feels like bruise.    Neurological: Negative for headaches.       Objective:   Vitals:   08/15/19 1522  BP: 126/78  Pulse: 72  Resp: 16  Temp: 99 F (37.2 C)  SpO2: 98%   BP Readings from Last 3 Encounters:  08/15/19 126/78  07/14/19 124/76  11/17/18 (!) 152/88   Wt Readings from Last 3 Encounters:  08/15/19 153 lb 12.8 oz (69.8 kg)  07/14/19 159 lb 12.8 oz (72.5 kg)  11/17/18 162 lb (73.5 kg) (62 %, Z= 0.32)*   * Growth percentiles are based on CDC (Boys, 2-20 Years) data.   Body mass index is 19.22 kg/m.   Physical Exam Constitutional:      General: He is not in acute distress.    Appearance: Normal appearance. He is not ill-appearing.  HENT:     Head: Normocephalic and atraumatic.     Right Ear: Tympanic membrane, ear canal and  external ear normal.     Left Ear: Tympanic membrane, ear canal and external ear normal.     Nose: Nose normal.     Mouth/Throat:     Mouth: Mucous membranes are moist.     Pharynx: No oropharyngeal exudate or posterior oropharyngeal erythema.  Eyes:     General: No scleral icterus.       Right eye: No discharge.        Left eye: No discharge.     Extraocular Movements: Extraocular movements intact.     Conjunctiva/sclera: Conjunctivae normal.     Comments: Right upper medial eyelid with mild swelling and erythema.  No obvious stye.    Neck:     Musculoskeletal: Neck supple. No muscular tenderness.  Lymphadenopathy:     Cervical: No cervical adenopathy.  Skin:    General: Skin is warm and dry.  Neurological:     Mental Status: He is alert.            Assessment & Plan:    See Problem List for Assessment and Plan of chronic medical problems.

## 2019-08-15 NOTE — Assessment & Plan Note (Signed)
Right upper medial eyelid swelling and erythema along with mild tenderness-likely early stye Warm compresses We will prescribe Polytrim-1 drop every 4 hours Advised for him to call if symptoms do not improve over the next couple of days

## 2019-08-18 LAB — QUANTIFERON-TB GOLD PLUS
Mitogen-NIL: >10 IU/mL
NIL: 0.07 IU/mL
QuantiFERON-TB Gold Plus: NEGATIVE
TB1-NIL: 0 IU/mL
TB2-NIL: <0 IU/mL

## 2019-09-08 ENCOUNTER — Ambulatory Visit (INDEPENDENT_AMBULATORY_CARE_PROVIDER_SITE_OTHER): Payer: 59 | Admitting: Family

## 2019-09-08 ENCOUNTER — Encounter: Payer: Self-pay | Admitting: Family

## 2019-09-08 ENCOUNTER — Other Ambulatory Visit: Payer: Self-pay

## 2019-09-08 VITALS — BP 116/76 | HR 84 | Temp 98.1°F | Ht 75.0 in | Wt 158.8 lb

## 2019-09-08 DIAGNOSIS — H6122 Impacted cerumen, left ear: Secondary | ICD-10-CM

## 2019-09-08 MED ORDER — NEOMYCIN-POLYMYXIN-HC 3.5-10000-1 OT SOLN: 3.0000 [drp] | Freq: Four times a day (QID) | OTIC | 0 refills | Status: DC

## 2019-09-08 MED FILL — NEO/POLYMYXIN/HC EAR SOLN: 3.5-10000-1 | 16 days supply | Qty: 10 | Fill #0

## 2019-09-08 NOTE — Progress Notes (Signed)
Patient consent obtained. Irrigation with water and peroxide performed. Full view of tympanic membranes after procedure.  Patient tolerated procedure well.   

## 2019-09-08 NOTE — Progress Notes (Signed)
  Raymond Brock is a 18 y.o. male with the following history as recorded in [employer]:  Patient Active Problem List   Diagnosis Date Noted  . Stye 08/15/2019  . Right-sided low back pain without sciatica 05/23/2015  . 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 Heel pain 03/13/2013    Current Outpatient Medications  Medication Sig Dispense Refill  . montelukast (SINGULAIR) 10 MG tablet     . neomycin-polymyxin-hydrocortisone (CORTISPORIN) OTIC solution Place 3 drops into the left ear 4 (four) times daily. 10 mL 0   No current facility-administered medications for this visit.     Allergies: Patient has no known allergies.  Past Medical History:  Diagnosis Date  . 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 NO PAST SURGERIES      Family History  Problem Relation Age of Onset  . Cancer Mother   . Atrial fibrillation Father   . Hypertension Maternal Grandfather   . Cancer Maternal Grandfather        Colon  . Hyperlipidemia Maternal Grandfather   . Cancer Paternal Grandmother   . Hypertension Maternal Grandmother     Social History   Tobacco Use  . Smoking status: Never Smoker  . Smokeless tobacco: Never Used  Substance Use Topics  . Alcohol use: No    Subjective:  1 day history of left ear pain; woke up overnight with pain; no cough/ congestion; no recent travel or swimming; no fever; does have seasonal allergies and is taking his medication;  Objective:  Vitals:   09/08/19 1527  BP: 116/76  Pulse: 84  Temp: 98.1 F (36.7 C)  TempSrc: Oral  SpO2: 97%  Weight: 158 lb 12.8 oz (72 kg)  Height: 6\' 3"  (1.905 m)    General: Well developed, well nourished, in no acute distress  Skin : Warm and dry.  Head: Normocephalic and atraumatic  Eyes: Sclera and conjunctiva clear; pupils round and reactive to light; extraocular movements intact  Ears: Left ear canal noted initially impacted with  cerumen; after lavage, External normal; left canal erythematous; tympanic membranes normal  Oropharynx: Pink, supple. No suspicious lesions  Neck: Supple without thyromegaly, adenopathy  Lungs: Respirations unlabored;  Neurologic: Alert and oriented; speech intact; face symmetrical; moves all extremities well; CNII-XII intact without focal deficit   Assessment:  1. Impacted cerumen of left ear     Plan:  Ear lavage completed with no difficulty; Rx for Cortisporin Otic- use as directed; follow-up worse, no better.   No follow-ups on file.  No orders of the defined types were placed in this encounter.   Requested Prescriptions   Signed Prescriptions Disp Refills  . neomycin-polymyxin-hydrocortisone (CORTISPORIN) OTIC solution 10 mL 0    Sig: Place 3 drops into the left ear 4 (four) times daily.

## 2019-11-28 MED FILL — MONTELUKAST SOD 10 MG TAB: 10 | 90 days supply | Qty: 90 | Fill #5

## 2020-02-12 ENCOUNTER — Ambulatory Visit: Payer: 59 | Attending: Internal Medicine

## 2020-02-12 DIAGNOSIS — Z23 Encounter for immunization: Secondary | ICD-10-CM

## 2020-02-12 NOTE — Progress Notes (Signed)
   Covid-19 Vaccination Clinic  Name:  Raymond Brock    MRN: [id number] DOB: 24-May-2001  02/12/2020  Mr. Rancourt was observed post Covid-19 immunization for 15 minutes without incident. He was provided with Vaccine Information Sheet and instruction to access the V-Safe system.   Mr. Ferrando was instructed to call 911 with any severe reactions post vaccine: Marland Kitchen Difficulty breathing  . Swelling of face and throat  . A fast heartbeat  . A bad rash all over body  . Dizziness and weakness   Immunizations Administered    Name Date Dose VIS Date Route   Pfizer COVID-19 Vaccine 02/12/2020  4:10 PM 0.3 mL 11/10/2019 Intramuscular   Manufacturer: [place]   Lot: [id number]   [place]: [id number]

## 2020-03-05 ENCOUNTER — Ambulatory Visit: Payer: 59

## 2020-03-05 ENCOUNTER — Ambulatory Visit: Payer: 59 | Attending: Internal Medicine

## 2020-03-05 DIAGNOSIS — Z23 Encounter for immunization: Secondary | ICD-10-CM

## 2020-03-05 NOTE — Progress Notes (Signed)
   Covid-19 Vaccination Clinic  Name:  Raymond Brock    MRN: [id number] DOB: [birth year]  03/05/2020  Raymond Brock was observed post Covid-19 immunization for 15 minutes without incident. He was provided with Vaccine Information Sheet and instruction to access the V-Safe system.   [name] was instructed to call 911 with any severe reactions post vaccine: Marland Kitchen Difficulty breathing  . Swelling of face and throat  . A fast heartbeat  . A bad rash all over body  . Dizziness and weakness   Immunizations Administered    Name Date Dose VIS Date Route   Pfizer COVID-19 Vaccine 03/05/2020 11:50 AM 0.3 mL 11/10/2019 Intramuscular   Manufacturer: [employer]   Lot: [id number]   [place]: [id number]

## 2020-03-06 NOTE — Progress Notes (Signed)
Subjective:    Patient ID: Raymond Brock, male    DOB: May 25, 2001, 19 y.o.   MRN: [id number]  HPI The patient is here for an acute visit.  Recently he has not been feeling well.  He feels more irritable, has some mild depression and anxiety.  Initially he thought it may be related to the singular because he knows that medication can affect mood and cause depression.  He was wondering if he should try something different.  He has been on the medication for a while and has done well with it without side effects.  It does help with his allergy symptoms.  He also always feels tired and wonders if this is related to the Singulair.  His sleep varies, but his sleep quality is good and it does not matter if he gets a lot of sleep or 7 hours of sleep he still feels tired.  In talking with him and what he realized last night that most likely he is more irritable, anxious and depressed because he is finishing up his sophomore year of college-he is doing this remotely.  He has 3 weeks left and finals are approaching.  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  He feels that he is doing too much and he cannot concentrate on school, trying for this job and do some the other things he needs and wants to do.  In reflection he thinks all of his symptoms started when he started training for this job.  He thinks it is probably not a good idea to be doing that at this time.  He would consider doing it in the summer, but not at the same time to school.  Medications and allergies reviewed with patient and updated if appropriate.  Patient Active Problem List   Diagnosis Date Noted  . Allergic rhinitis 03/07/2020  . Stye 08/15/2019  . Right-sided low back pain without sciatica 05/23/2015  . 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 Heel pain 03/13/2013    Current Outpatient Medications on File Prior to Visit  Medication Sig Dispense Refill  .  montelukast (SINGULAIR) 10 MG tablet      No current facility-administered medications on file prior to visit.    Past Medical History:  Diagnosis Date  . 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 NO PAST SURGERIES      Social History   Socioeconomic History  . Marital status: Single    Spouse name: Not on file  . Number of children: Not on file  . Years of education: Not on file  . Highest education level: Not on file  Occupational History  . Not on file  Tobacco Use  . Smoking status: Never Smoker  . Smokeless tobacco: Never Used  Substance and Sexual Activity  . Alcohol use: No  . Drug use: No  . Sexual activity: Not on file  Other Topics Concern  . Not on file  Social History Narrative  . Not on file   Social Determinants of Health   Financial Resource Strain:   . Difficulty of Paying Living Expenses:   Food Insecurity:   . Worried About Charity fundraiser in the Last Year:   . Arboriculturist in the Last Year:   Transportation Needs:   . Lack  of Transportation (Medical):   Marland Kitchen Lack of Transportation (Non-Medical):   Physical Activity:   . Days of Exercise per Week:   . Minutes of Exercise per Session:   Stress:   . Feeling of Stress :   Social Connections:   . Frequency of Communication with Friends and Family:   . Frequency of Social Gatherings with Friends and Family:   . Attends Religious Services:   . Active Member of Clubs or Organizations:   . Attends Archivist Meetings:   Marland Kitchen Marital Status:     Family History  Problem Relation Age of Onset  . Cancer Mother   . Atrial fibrillation Father   . Hypertension Maternal Grandfather   . Cancer Maternal Grandfather        Colon  . Hyperlipidemia Maternal Grandfather   . Cancer Paternal Grandmother   . Hypertension Maternal Grandmother     Review of Systems  Constitutional: Positive for  fatigue. Negative for fever.  Respiratory: Negative for shortness of breath.   Cardiovascular: Positive for palpitations. Negative for chest pain.  Gastrointestinal: Negative for abdominal pain, constipation, diarrhea and nausea.  Psychiatric/Behavioral: Positive for dysphoric mood. Negative for sleep disturbance. The patient is nervous/anxious.        Objective:   Vitals:   03/07/20 0754  BP: 136/86  Pulse: 84  Resp: 16  Temp: 98.4 F (36.9 C)  SpO2: 98%   BP Readings from Last 3 Encounters:  03/07/20 136/86  09/08/19 116/76  08/15/19 126/78   Wt Readings from Last 3 Encounters:  03/07/20 167 lb 12.8 oz (76.1 kg)  09/08/19 158 lb 12.8 oz (72 kg)  08/15/19 153 lb 12.8 oz (69.8 kg)   Body mass index is 20.97 kg/m.   Physical Exam Constitutional:      General: He is not in acute distress.    Appearance: Normal appearance. He is not ill-appearing.  HENT:     Head: Normocephalic and atraumatic.  Skin:    General: Skin is warm and dry.  Neurological:     General: No focal deficit present.     Mental Status: He is alert and oriented to person, place, and time.  Psychiatric:        Mood and Affect: Mood normal.        Behavior: Behavior normal.        Thought Content: Thought content normal.        Judgment: Judgment normal.            Assessment & Plan:    See Problem List for Assessment and Plan of chronic medical problems.    This visit occurred during the SARS-CoV-2 public health emergency.  Safety protocols were in place, including screening questions prior to the visit, additional usage of staff PPE, and extensive cleaning of exam room while observing appropriate contact time as indicated for disinfecting solutions.

## 2020-03-07 ENCOUNTER — Other Ambulatory Visit: Payer: Self-pay

## 2020-03-07 ENCOUNTER — Ambulatory Visit (INDEPENDENT_AMBULATORY_CARE_PROVIDER_SITE_OTHER): Payer: 59 | Admitting: Internal Medicine

## 2020-03-07 ENCOUNTER — Encounter: Payer: Self-pay | Admitting: Internal Medicine

## 2020-03-07 DIAGNOSIS — F4329 Adjustment disorder with other symptoms: Secondary | ICD-10-CM | POA: Diagnosis not present

## 2020-03-07 DIAGNOSIS — R5383 Other fatigue: Secondary | ICD-10-CM | POA: Insufficient documentation

## 2020-03-07 DIAGNOSIS — J309 Allergic rhinitis, unspecified: Secondary | ICD-10-CM | POA: Diagnosis not present

## 2020-03-07 NOTE — Assessment & Plan Note (Signed)
Acute He is experiencing increased stress with some mild anxiety and depression as a result Increased stress is related to doing school remotely and just starting a job.  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

## 2020-03-07 NOTE — Patient Instructions (Signed)
Do a trial of stopping Singulair to see if your energy level improves.  You can try Claritin for your allergies to see if it helps.    If your symptoms do not improve please let me know.

## 2020-03-07 NOTE — Assessment & Plan Note (Signed)
He has some chronic, nonspecific fatigue States he just feels tired all the time Blood work last August within normal limits ?  Related to the Singulair Discussed that he could try stopping it for a brief period of time to see if that helps with his energy level Can try something like Claritin for his allergies to see if that helps in the meantime Discussed the importance of structured, consistent sleep and making sure he is getting enough.  Sleep quality is good He will let me know if this does not improve or if he has further questions

## 2020-04-25 MED FILL — MONTELUKAST SOD 10 MG TAB: 10 | 90 days supply | Qty: 90 | Fill #0

## 2020-06-10 DIAGNOSIS — D225 Melanocytic nevi of trunk: Secondary | ICD-10-CM | POA: Diagnosis not present

## 2020-08-28 DIAGNOSIS — R05 Cough: Secondary | ICD-10-CM | POA: Diagnosis not present

## 2020-08-28 DIAGNOSIS — R0981 Nasal congestion: Secondary | ICD-10-CM | POA: Diagnosis not present

## 2020-08-28 DIAGNOSIS — M791 Myalgia, unspecified site: Secondary | ICD-10-CM | POA: Diagnosis not present

## 2020-08-28 DIAGNOSIS — R519 Headache, unspecified: Secondary | ICD-10-CM | POA: Diagnosis not present

## 2020-09-03 DIAGNOSIS — J205 Acute bronchitis due to respiratory syncytial virus: Secondary | ICD-10-CM | POA: Diagnosis not present

## 2020-09-03 DIAGNOSIS — R059 Cough, unspecified: Secondary | ICD-10-CM | POA: Diagnosis not present

## 2020-10-25 ENCOUNTER — Other Ambulatory Visit (HOSPITAL_COMMUNITY): Payer: Self-pay

## 2020-10-25 MED FILL — MONTELUKAST SOD 10 MG TAB: 10 | 90 days supply | Qty: 90 | Fill #0

## 2020-11-21 ENCOUNTER — Other Ambulatory Visit: Payer: 59

## 2020-11-21 DIAGNOSIS — Z20822 Contact with and (suspected) exposure to covid-19: Secondary | ICD-10-CM | POA: Diagnosis not present

## 2020-11-23 LAB — NOVEL CORONAVIRUS, NAA: SARS-CoV-2, NAA: NOT DETECTED

## 2020-11-23 LAB — SARS-COV-2, NAA 2 DAY TAT

## 2020-12-09 NOTE — Patient Instructions (Addendum)
Flu immunization administered today.    Medications changes include :   none   Your prescription(s) have been submitted to your pharmacy.    Please followup in 1 year    Health Maintenance, Male Adopting a healthy lifestyle and getting preventive care are important in promoting health and wellness. Ask your health care provider about:  The right schedule for you to have regular tests and exams.  Things you can do on your own to prevent diseases and keep yourself healthy. What should I know about diet, weight, and exercise? Eat a healthy diet  Eat a diet that includes plenty of vegetables, fruits, low-fat dairy products, and lean protein.  Do not eat a lot of foods that are high in solid fats, added sugars, or sodium.   Maintain a healthy weight Body mass index (BMI) is a measurement that can be used to identify possible weight problems. It estimates body fat based on height and weight. Your health care provider can help determine your BMI and help you achieve or maintain a healthy weight. Get regular exercise Get regular exercise. This is one of the most important things you can do for your health. Most adults should:  Exercise for at least 150 minutes each week. The exercise should increase your heart rate and make you sweat (moderate-intensity exercise).  Do strengthening exercises at least twice a week. This is in addition to the moderate-intensity exercise.  Spend less time sitting. Even light physical activity can be beneficial. Watch cholesterol and blood lipids Have your blood tested for lipids and cholesterol at 22 years of age, then have this test every 5 years. You may need to have your cholesterol levels checked more often if:  Your lipid or cholesterol levels are high.  You are older than 22 years of age.  You are at high risk for heart disease. What should I know about cancer screening? Many types of cancers can be detected early and may often be prevented.  Depending on your health history and family history, you may need to have cancer screening at various ages. This may include screening for:  Colorectal cancer.  Prostate cancer.  Skin cancer.  Lung cancer. What should I know about heart disease, diabetes, and high blood pressure? Blood pressure and heart disease  High blood pressure causes heart disease and increases the risk of stroke. This is more likely to develop in people who have high blood pressure readings, are of African descent, or are overweight.  Talk with your health care provider about your target blood pressure readings.  Have your blood pressure checked: ? Every 3-5 years if you are 60-50 years of age. ? Every year if you are 51 years old or older.  If you are between the ages of 33 and 81 and are a current or former smoker, ask your health care provider if you should have a one-time screening for abdominal aortic aneurysm (AAA). Diabetes Have regular diabetes screenings. This checks your fasting blood sugar level. Have the screening done:  Once every three years after age 81 if you are at a normal weight and have a low risk for diabetes.  More often and at a younger age if you are overweight or have a high risk for diabetes. What should I know about preventing infection? Hepatitis B If you have a higher risk for hepatitis B, you should be screened for this virus. Talk with your health care provider to find out if you are at risk for hepatitis  B infection. Hepatitis C Blood testing is recommended for:  Everyone born from [birth year] through 1967.  Anyone with known risk factors for hepatitis C. Sexually transmitted infections (STIs)  You should be screened each year for STIs, including gonorrhea and chlamydia, if: ? You are sexually active and are younger than 22 years of age. ? You are older than 22 years of age and your health care provider tells you that you are at risk for this type of infection. ? Your sexual  activity has changed since you were last screened, and you are at increased risk for chlamydia or gonorrhea. Ask your health care provider if you are at risk.  Ask your health care provider about whether you are at high risk for HIV. Your health care provider may recommend a prescription medicine to help prevent HIV infection. If you choose to take medicine to prevent HIV, you should first get tested for HIV. You should then be tested every 3 months for as long as you are taking the medicine. Follow these instructions at home: Lifestyle  Do not use any products that contain nicotine or tobacco, such as cigarettes, e-cigarettes, and chewing tobacco. If you need help quitting, ask your health care provider.  Do not use street drugs.  Do not share needles.  Ask your health care provider for help if you need support or information about quitting drugs. Alcohol use  Do not drink alcohol if your health care provider tells you not to drink.  If you drink alcohol: ? Limit how much you have to 0-2 drinks a day. ? Be aware of how much alcohol is in your drink. In the [place] one drink equals one 12 oz bottle of beer (355 mL), one 5 oz glass of wine (148 mL), or one 1 oz glass of hard liquor (44 mL). General instructions  Schedule regular health, dental, and eye exams.  Stay current with your vaccines.  Tell your health care provider if: ? You often feel depressed. ? You have ever been abused or do not feel safe at home. Summary  Adopting a healthy lifestyle and getting preventive care are important in promoting health and wellness.  Follow your health care provider's instructions about healthy diet, exercising, and getting tested or screened for diseases.  Follow your health care provider's instructions on monitoring your cholesterol and blood pressure. This information is not intended to replace advice given to you by your health care provider. Make sure you discuss any questions you have  with your health care provider. Document Revised: 11/09/2018 Document Reviewed: 11/09/2018 Elsevier Patient Education  2021 [employer].

## 2020-12-09 NOTE — Progress Notes (Signed)
Subjective:    Patient ID: Raymond Brock, male    DOB: 2001-06-11, 20 y.o.   MRN: [id number]  HPI He is here for a physical exam.   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   He has no concerns - overall doing well.  Feels his meds are working well.    Medications and allergies reviewed with patient and updated if appropriate.  Patient Active Problem List   Diagnosis Date Noted  . Allergic rhinitis 03/07/2020  . Stress and adjustment reaction 03/07/2020  . Right-sided low back pain without sciatica 05/23/2015  . 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 Heel pain 03/13/2013    Current Outpatient Medications on File Prior to Visit  Medication Sig Dispense Refill  . albuterol (VENTOLIN HFA) 108 (90 Base) MCG/ACT inhaler Inhale into the lungs.    Marland Kitchen escitalopram (LEXAPRO) 10 MG tablet Take 10 mg by mouth daily.     No current facility-administered medications on file prior to visit.    Past Medical History:  Diagnosis Date  . 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 NO PAST SURGERIES      Social History   Socioeconomic History  . Marital status: Single    Spouse name: Not on file  . Number of children: Not on file  . Years of education: Not on file  . Highest education level: Not on file  Occupational History  . Not on file  Tobacco Use  . Smoking status: Never Smoker  . Smokeless tobacco: Never Used  Substance and Sexual Activity  . Alcohol use: No  . Drug use: No  . Sexual activity: Not on file  Other Topics Concern  . Not on file  Social History Narrative  . Not on file   Social Determinants of Health   Financial Resource Strain: Not on file  Food Insecurity: Not on file  Transportation Needs: Not on file  Physical Activity: Not on file  Stress: Not on file  Social Connections: Not on file    Family  History  Problem Relation Age of Onset  . Cancer Mother   . Atrial fibrillation Father   . Hypertension Maternal Grandfather   . Cancer Maternal Grandfather        Colon  . Hyperlipidemia Maternal Grandfather   . Cancer Paternal Grandmother   . Hypertension Maternal Grandmother     Review of Systems  Constitutional: Negative for chills and fever.  Eyes: Negative for visual disturbance.  Respiratory: Negative for cough, shortness of breath and wheezing.   Cardiovascular: Negative for chest pain, palpitations and leg swelling.  Gastrointestinal: Negative for abdominal pain, blood in stool, constipation, diarrhea and nausea.       No gerd  Genitourinary: Negative for dysuria and hematuria.  Musculoskeletal: Negative for arthralgias and back pain.  Skin: Negative for rash.  Neurological: Negative for light-headedness and headaches.  Psychiatric/Behavioral: Positive for dysphoric mood. The patient is nervous/anxious.        Objective:   Vitals:   12/10/20 0912  BP: 122/74  Pulse: 69  Temp: 98 F (36.7 C)  SpO2: 97%   Filed Weights   12/10/20 0912  Weight: 171 lb 6.4 oz (77.7 kg)   Body mass index is 21.42 kg/m.  BP Readings from Last 3 Encounters:  12/10/20 122/74  03/07/20 136/86  09/08/19 116/76    Wt Readings from Last 3 Encounters:  12/10/20 171 lb 6.4 oz (77.7 kg)  03/07/20 167 lb 12.8 oz (76.1 kg)  09/08/19 158 lb 12.8 oz (72 kg)     Physical Exam Constitutional: He appears well-developed and well-nourished. No distress.  HENT:  Head: Normocephalic and atraumatic.  Right Ear: External ear normal.  Left Ear: External ear normal.  Mouth/Throat: Oropharynx is clear and moist.  Normal ear canals and TM b/l  Eyes: Conjunctivae and EOM are normal.  Neck: Neck supple. No tracheal deviation present. No thyromegaly present.  No carotid bruit  Cardiovascular: Normal rate, regular rhythm, normal heart sounds and intact distal pulses.   No murmur  heard. Pulmonary/Chest: Effort normal and breath sounds normal. No respiratory distress. He has no wheezes. He has no rales.  Abdominal: Soft. He exhibits no distension. There is no tenderness.  Genitourinary: deferred  Musculoskeletal: He exhibits no edema.  Lymphadenopathy:   He has no cervical adenopathy.  Skin: Skin is warm and dry. He is not diaphoretic.  Psychiatric: He has a normal mood and affect. His behavior is normal.         Assessment & Plan:   Physical exam: Screening blood work  deferred Immunizations  tdap up to date, flu vac today  Exercise   regular Weight  normal Substance abuse  none     See Problem List for Assessment and Plan of chronic medical problems.   This visit occurred during the SARS-CoV-2 public health emergency.  Safety protocols were in place, including screening questions prior to the visit, additional usage of staff PPE, and extensive cleaning of exam room while observing appropriate contact time as indicated for disinfecting solutions.

## 2020-12-10 ENCOUNTER — Ambulatory Visit (INDEPENDENT_AMBULATORY_CARE_PROVIDER_SITE_OTHER): Payer: 59 | Admitting: Internal Medicine

## 2020-12-10 ENCOUNTER — Encounter: Payer: Self-pay | Admitting: Internal Medicine

## 2020-12-10 ENCOUNTER — Other Ambulatory Visit: Payer: Self-pay | Admitting: Internal Medicine

## 2020-12-10 ENCOUNTER — Other Ambulatory Visit: Payer: Self-pay

## 2020-12-10 VITALS — BP 122/74 | HR 69 | Temp 98.0°F | Wt 171.4 lb

## 2020-12-10 DIAGNOSIS — Z23 Encounter for immunization: Secondary | ICD-10-CM | POA: Diagnosis not present

## 2020-12-10 DIAGNOSIS — F4329 Adjustment disorder with other symptoms: Secondary | ICD-10-CM | POA: Diagnosis not present

## 2020-12-10 DIAGNOSIS — Z0000 Encounter for general adult medical examination without abnormal findings: Secondary | ICD-10-CM | POA: Diagnosis not present

## 2020-12-10 DIAGNOSIS — J309 Allergic rhinitis, unspecified: Secondary | ICD-10-CM

## 2020-12-10 MED ORDER — MONTELUKAST SODIUM 10 MG PO TABS: 10.0000 mg | ORAL_TABLET | Freq: Every day | ORAL | 3 refills | Status: DC

## 2020-12-10 NOTE — Assessment & Plan Note (Signed)
Chronic °Controlled, stable °Continue singulair 10 mg nightly ° °

## 2020-12-10 NOTE — Assessment & Plan Note (Signed)
Chronic Controlled, stable Continue lexapro 10 mg daily  

## 2021-01-23 MED FILL — MONTELUKAST SOD 10 MG TAB: 10 | 90 days supply | Qty: 90 | Fill #1

## 2021-01-23 MED FILL — MONTELUKAST SOD 10 MG TAB: 10 | 90 days supply | Qty: 90 | Fill #0

## 2021-01-28 ENCOUNTER — Other Ambulatory Visit (HOSPITAL_COMMUNITY): Payer: Self-pay

## 2021-01-28 MED FILL — SERTRALINE HCL 50 MG TABS: 50 | 90 days supply | Qty: 90 | Fill #0

## 2021-04-13 DIAGNOSIS — M791 Myalgia, unspecified site: Secondary | ICD-10-CM | POA: Diagnosis not present

## 2021-04-13 DIAGNOSIS — Z1159 Encounter for screening for other viral diseases: Secondary | ICD-10-CM | POA: Diagnosis not present

## 2021-04-13 DIAGNOSIS — Z20822 Contact with and (suspected) exposure to covid-19: Secondary | ICD-10-CM | POA: Diagnosis not present

## 2021-04-13 DIAGNOSIS — J069 Acute upper respiratory infection, unspecified: Secondary | ICD-10-CM | POA: Diagnosis not present

## 2021-04-13 DIAGNOSIS — R059 Cough, unspecified: Secondary | ICD-10-CM | POA: Diagnosis not present

## 2021-05-30 DIAGNOSIS — M25571 Pain in right ankle and joints of right foot: Secondary | ICD-10-CM | POA: Diagnosis not present

## 2021-06-11 DIAGNOSIS — M25571 Pain in right ankle and joints of right foot: Secondary | ICD-10-CM | POA: Diagnosis not present

## 2021-07-18 DIAGNOSIS — M79671 Pain in right foot: Secondary | ICD-10-CM | POA: Diagnosis not present

## 2021-07-18 DIAGNOSIS — S92351A Displaced fracture of fifth metatarsal bone, right foot, initial encounter for closed fracture: Secondary | ICD-10-CM | POA: Diagnosis not present

## 2021-10-16 ENCOUNTER — Other Ambulatory Visit (HOSPITAL_COMMUNITY): Payer: Self-pay

## 2021-12-19 DIAGNOSIS — J069 Acute upper respiratory infection, unspecified: Secondary | ICD-10-CM | POA: Diagnosis not present

## 2022-01-14 DIAGNOSIS — F419 Anxiety disorder, unspecified: Secondary | ICD-10-CM | POA: Diagnosis not present

## 2022-01-14 DIAGNOSIS — R5383 Other fatigue: Secondary | ICD-10-CM | POA: Diagnosis not present

## 2022-01-14 DIAGNOSIS — Z8249 Family history of ischemic heart disease and other diseases of the circulatory system: Secondary | ICD-10-CM | POA: Diagnosis not present

## 2022-01-14 DIAGNOSIS — J45909 Unspecified asthma, uncomplicated: Secondary | ICD-10-CM | POA: Diagnosis not present

## 2022-01-14 DIAGNOSIS — R000 Tachycardia, unspecified: Secondary | ICD-10-CM | POA: Diagnosis not present

## 2022-01-23 DIAGNOSIS — Z8249 Family history of ischemic heart disease and other diseases of the circulatory system: Secondary | ICD-10-CM | POA: Diagnosis not present

## 2022-01-23 DIAGNOSIS — R5383 Other fatigue: Secondary | ICD-10-CM | POA: Diagnosis not present

## 2022-01-23 DIAGNOSIS — R000 Tachycardia, unspecified: Secondary | ICD-10-CM | POA: Diagnosis not present

## 2022-01-23 DIAGNOSIS — R079 Chest pain, unspecified: Secondary | ICD-10-CM | POA: Diagnosis not present

## 2022-02-17 DIAGNOSIS — R109 Unspecified abdominal pain: Secondary | ICD-10-CM | POA: Diagnosis not present

## 2022-02-17 DIAGNOSIS — K920 Hematemesis: Secondary | ICD-10-CM | POA: Diagnosis not present

## 2022-02-17 DIAGNOSIS — F17200 Nicotine dependence, unspecified, uncomplicated: Secondary | ICD-10-CM | POA: Diagnosis not present

## 2022-02-17 DIAGNOSIS — R112 Nausea with vomiting, unspecified: Secondary | ICD-10-CM | POA: Diagnosis not present

## 2022-02-17 DIAGNOSIS — E860 Dehydration: Secondary | ICD-10-CM | POA: Diagnosis not present

## 2022-02-17 DIAGNOSIS — R197 Diarrhea, unspecified: Secondary | ICD-10-CM | POA: Diagnosis not present

## 2022-03-13 DIAGNOSIS — F17290 Nicotine dependence, other tobacco product, uncomplicated: Secondary | ICD-10-CM | POA: Diagnosis not present

## 2022-03-13 DIAGNOSIS — F339 Major depressive disorder, recurrent, unspecified: Secondary | ICD-10-CM | POA: Diagnosis not present

## 2022-03-20 DIAGNOSIS — F339 Major depressive disorder, recurrent, unspecified: Secondary | ICD-10-CM | POA: Diagnosis not present

## 2022-03-20 DIAGNOSIS — F17290 Nicotine dependence, other tobacco product, uncomplicated: Secondary | ICD-10-CM | POA: Diagnosis not present

## 2022-03-24 DIAGNOSIS — J988 Other specified respiratory disorders: Secondary | ICD-10-CM | POA: Diagnosis not present

## 2022-03-27 DIAGNOSIS — F17290 Nicotine dependence, other tobacco product, uncomplicated: Secondary | ICD-10-CM | POA: Diagnosis not present

## 2022-03-27 DIAGNOSIS — F339 Major depressive disorder, recurrent, unspecified: Secondary | ICD-10-CM | POA: Diagnosis not present

## 2022-04-10 DIAGNOSIS — F17290 Nicotine dependence, other tobacco product, uncomplicated: Secondary | ICD-10-CM | POA: Diagnosis not present

## 2022-04-10 DIAGNOSIS — F339 Major depressive disorder, recurrent, unspecified: Secondary | ICD-10-CM | POA: Diagnosis not present

## 2022-04-17 DIAGNOSIS — F339 Major depressive disorder, recurrent, unspecified: Secondary | ICD-10-CM | POA: Diagnosis not present

## 2022-04-17 DIAGNOSIS — F4010 Social phobia, unspecified: Secondary | ICD-10-CM | POA: Diagnosis not present

## 2022-04-17 DIAGNOSIS — F17290 Nicotine dependence, other tobacco product, uncomplicated: Secondary | ICD-10-CM | POA: Diagnosis not present

## 2022-04-24 DIAGNOSIS — F339 Major depressive disorder, recurrent, unspecified: Secondary | ICD-10-CM | POA: Diagnosis not present

## 2022-04-24 DIAGNOSIS — F4010 Social phobia, unspecified: Secondary | ICD-10-CM | POA: Diagnosis not present

## 2022-04-24 DIAGNOSIS — F17290 Nicotine dependence, other tobacco product, uncomplicated: Secondary | ICD-10-CM | POA: Diagnosis not present

## 2022-05-01 DIAGNOSIS — F4010 Social phobia, unspecified: Secondary | ICD-10-CM | POA: Diagnosis not present

## 2022-05-01 DIAGNOSIS — F339 Major depressive disorder, recurrent, unspecified: Secondary | ICD-10-CM | POA: Diagnosis not present

## 2022-05-01 DIAGNOSIS — F17290 Nicotine dependence, other tobacco product, uncomplicated: Secondary | ICD-10-CM | POA: Diagnosis not present

## 2022-05-08 DIAGNOSIS — F17290 Nicotine dependence, other tobacco product, uncomplicated: Secondary | ICD-10-CM | POA: Diagnosis not present

## 2022-05-08 DIAGNOSIS — F4010 Social phobia, unspecified: Secondary | ICD-10-CM | POA: Diagnosis not present

## 2022-05-08 DIAGNOSIS — F339 Major depressive disorder, recurrent, unspecified: Secondary | ICD-10-CM | POA: Diagnosis not present

## 2022-05-11 DIAGNOSIS — R002 Palpitations: Secondary | ICD-10-CM | POA: Diagnosis not present

## 2022-05-15 DIAGNOSIS — F4010 Social phobia, unspecified: Secondary | ICD-10-CM | POA: Diagnosis not present

## 2022-05-15 DIAGNOSIS — F17290 Nicotine dependence, other tobacco product, uncomplicated: Secondary | ICD-10-CM | POA: Diagnosis not present

## 2022-05-15 DIAGNOSIS — F339 Major depressive disorder, recurrent, unspecified: Secondary | ICD-10-CM | POA: Diagnosis not present

## 2022-05-22 DIAGNOSIS — F4010 Social phobia, unspecified: Secondary | ICD-10-CM | POA: Diagnosis not present

## 2022-05-22 DIAGNOSIS — F339 Major depressive disorder, recurrent, unspecified: Secondary | ICD-10-CM | POA: Diagnosis not present

## 2022-05-22 DIAGNOSIS — F17290 Nicotine dependence, other tobacco product, uncomplicated: Secondary | ICD-10-CM | POA: Diagnosis not present

## 2022-05-28 DIAGNOSIS — R002 Palpitations: Secondary | ICD-10-CM | POA: Diagnosis not present

## 2022-05-29 DIAGNOSIS — R002 Palpitations: Secondary | ICD-10-CM | POA: Diagnosis not present

## 2022-05-29 DIAGNOSIS — F4010 Social phobia, unspecified: Secondary | ICD-10-CM | POA: Diagnosis not present

## 2022-05-29 DIAGNOSIS — F411 Generalized anxiety disorder: Secondary | ICD-10-CM | POA: Diagnosis not present

## 2022-05-29 DIAGNOSIS — F339 Major depressive disorder, recurrent, unspecified: Secondary | ICD-10-CM | POA: Diagnosis not present

## 2022-06-04 DIAGNOSIS — F339 Major depressive disorder, recurrent, unspecified: Secondary | ICD-10-CM | POA: Diagnosis not present

## 2022-06-04 DIAGNOSIS — F411 Generalized anxiety disorder: Secondary | ICD-10-CM | POA: Diagnosis not present

## 2022-06-04 DIAGNOSIS — F4010 Social phobia, unspecified: Secondary | ICD-10-CM | POA: Diagnosis not present

## 2022-06-12 DIAGNOSIS — F411 Generalized anxiety disorder: Secondary | ICD-10-CM | POA: Diagnosis not present

## 2022-06-12 DIAGNOSIS — F4010 Social phobia, unspecified: Secondary | ICD-10-CM | POA: Diagnosis not present

## 2022-06-12 DIAGNOSIS — F339 Major depressive disorder, recurrent, unspecified: Secondary | ICD-10-CM | POA: Diagnosis not present

## 2022-06-19 DIAGNOSIS — F4010 Social phobia, unspecified: Secondary | ICD-10-CM | POA: Diagnosis not present

## 2022-06-19 DIAGNOSIS — F339 Major depressive disorder, recurrent, unspecified: Secondary | ICD-10-CM | POA: Diagnosis not present

## 2022-06-19 DIAGNOSIS — F411 Generalized anxiety disorder: Secondary | ICD-10-CM | POA: Diagnosis not present

## 2022-07-10 DIAGNOSIS — F411 Generalized anxiety disorder: Secondary | ICD-10-CM | POA: Diagnosis not present

## 2022-07-10 DIAGNOSIS — F339 Major depressive disorder, recurrent, unspecified: Secondary | ICD-10-CM | POA: Diagnosis not present

## 2022-07-10 DIAGNOSIS — F4010 Social phobia, unspecified: Secondary | ICD-10-CM | POA: Diagnosis not present

## 2022-07-15 DIAGNOSIS — F4010 Social phobia, unspecified: Secondary | ICD-10-CM | POA: Diagnosis not present

## 2022-07-15 DIAGNOSIS — F339 Major depressive disorder, recurrent, unspecified: Secondary | ICD-10-CM | POA: Diagnosis not present

## 2022-07-15 DIAGNOSIS — F411 Generalized anxiety disorder: Secondary | ICD-10-CM | POA: Diagnosis not present

## 2022-07-24 DIAGNOSIS — F339 Major depressive disorder, recurrent, unspecified: Secondary | ICD-10-CM | POA: Diagnosis not present

## 2022-07-24 DIAGNOSIS — F4010 Social phobia, unspecified: Secondary | ICD-10-CM | POA: Diagnosis not present

## 2022-07-24 DIAGNOSIS — F411 Generalized anxiety disorder: Secondary | ICD-10-CM | POA: Diagnosis not present

## 2022-08-07 DIAGNOSIS — F4010 Social phobia, unspecified: Secondary | ICD-10-CM | POA: Diagnosis not present

## 2022-08-20 DIAGNOSIS — F4010 Social phobia, unspecified: Secondary | ICD-10-CM | POA: Diagnosis not present

## 2022-09-03 DIAGNOSIS — F4010 Social phobia, unspecified: Secondary | ICD-10-CM | POA: Diagnosis not present

## 2022-09-21 DIAGNOSIS — J029 Acute pharyngitis, unspecified: Secondary | ICD-10-CM | POA: Diagnosis not present
